# Patient Record
Sex: Male | Born: 1960 | Race: Black or African American | Hispanic: No | Marital: Single | Smoking: Former smoker
Health system: Southern US, Community
[De-identification: ages and names within clinical notes are randomized; demographics above are authoritative.]

---

## 2019-06-27 ENCOUNTER — Emergency Department: Payer: No Typology Code available for payment source

## 2019-06-27 ENCOUNTER — Encounter: Payer: Self-pay | Admitting: Emergency Medicine

## 2019-06-27 ENCOUNTER — Emergency Department
Admission: EM | Admit: 2019-06-27 | Discharge: 2019-06-27 | Disposition: A | Discharge status: Home / Self Care | Payer: No Typology Code available for payment source | Attending: Emergency Medicine | Admitting: Emergency Medicine

## 2019-06-27 ENCOUNTER — Other Ambulatory Visit: Payer: Self-pay

## 2019-06-27 DIAGNOSIS — S199XXA Unspecified injury of neck, initial encounter: Secondary | ICD-10-CM | POA: Diagnosis present

## 2019-06-27 DIAGNOSIS — S161XXA Strain of muscle, fascia and tendon at neck level, initial encounter: Secondary | ICD-10-CM | POA: Insufficient documentation

## 2019-06-27 DIAGNOSIS — Y92410 Unspecified street and highway as the place of occurrence of the external cause: Secondary | ICD-10-CM | POA: Diagnosis not present

## 2019-06-27 DIAGNOSIS — I10 Essential (primary) hypertension: Secondary | ICD-10-CM

## 2019-06-27 DIAGNOSIS — M7918 Myalgia, other site: Secondary | ICD-10-CM

## 2019-06-27 DIAGNOSIS — Y93I9 Activity, other involving external motion: Secondary | ICD-10-CM | POA: Diagnosis not present

## 2019-06-27 DIAGNOSIS — Y998 Other external cause status: Secondary | ICD-10-CM | POA: Insufficient documentation

## 2019-06-27 MED ORDER — HYDROCHLOROTHIAZIDE 25 MG PO TABS: 25.0000 mg | ORAL_TABLET | Freq: Every day | ORAL | 0 refills | Status: AC

## 2019-06-27 MED ORDER — IBUPROFEN 600 MG PO TABS: 600.0000 mg | ORAL_TABLET | Freq: Three times a day (TID) | ORAL | 0 refills | Status: AC | PRN

## 2019-06-27 NOTE — Discharge Instructions (Addendum)
Follow-up with the [facility] urgent care where you think having your physicals and lab work done.  Call tomorrow to schedule an appointment.  Today your blood pressure was elevated with the last reading being 197/98.  A prescription for ibuprofen was sent to your pharmacy as well as hydrochlorothiazide which is used in hypertension.  This most likely will be changed once you see your primary care provider.  You may use ice or heat to your muscles as needed for discomfort.  You can expect to be sore for approximately 4 to 5 days.
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
